# Patient Record
Sex: Male | Born: 1945 | Race: White | Hispanic: No | Marital: Married | Smoking: Former smoker
Health system: Southern US, Community
[De-identification: ages and names within clinical notes are randomized; demographics above are authoritative.]

## PROBLEM LIST (undated history)

## (undated) DIAGNOSIS — I509 Heart failure, unspecified: Secondary | ICD-10-CM

## (undated) DIAGNOSIS — E119 Type 2 diabetes mellitus without complications: Secondary | ICD-10-CM

## (undated) DIAGNOSIS — N186 End stage renal disease: Secondary | ICD-10-CM

## (undated) DIAGNOSIS — I10 Essential (primary) hypertension: Secondary | ICD-10-CM

## (undated) DIAGNOSIS — D649 Anemia, unspecified: Secondary | ICD-10-CM

## (undated) DIAGNOSIS — N289 Disorder of kidney and ureter, unspecified: Secondary | ICD-10-CM

---

## 2004-04-28 ENCOUNTER — Other Ambulatory Visit: Payer: Self-pay

## 2004-04-28 ENCOUNTER — Ambulatory Visit: Payer: Self-pay | Admitting: Urology

## 2004-05-04 ENCOUNTER — Ambulatory Visit: Payer: Self-pay | Admitting: Urology

## 2004-06-14 ENCOUNTER — Other Ambulatory Visit: Payer: Self-pay

## 2004-06-14 ENCOUNTER — Ambulatory Visit: Payer: Self-pay | Admitting: Urology

## 2004-06-22 ENCOUNTER — Ambulatory Visit: Payer: Self-pay | Admitting: Urology

## 2004-08-04 ENCOUNTER — Ambulatory Visit: Payer: Self-pay | Admitting: Urology

## 2004-08-10 ENCOUNTER — Ambulatory Visit: Payer: Self-pay | Admitting: Urology

## 2006-02-20 ENCOUNTER — Ambulatory Visit: Payer: Self-pay | Admitting: Urology

## 2006-05-25 ENCOUNTER — Inpatient Hospital Stay: Payer: Self-pay | Admitting: Internal Medicine

## 2006-05-25 ENCOUNTER — Other Ambulatory Visit: Payer: Self-pay

## 2006-06-07 ENCOUNTER — Emergency Department: Payer: Self-pay | Admitting: Emergency Medicine

## 2006-06-07 ENCOUNTER — Other Ambulatory Visit: Payer: Self-pay

## 2006-10-06 ENCOUNTER — Other Ambulatory Visit: Payer: Self-pay

## 2006-10-06 ENCOUNTER — Inpatient Hospital Stay: Payer: Self-pay | Admitting: Internal Medicine

## 2006-10-29 ENCOUNTER — Other Ambulatory Visit: Payer: Self-pay

## 2006-10-29 ENCOUNTER — Inpatient Hospital Stay: Payer: Self-pay | Admitting: Internal Medicine

## 2007-03-14 ENCOUNTER — Ambulatory Visit: Payer: Self-pay | Admitting: Urology

## 2007-11-16 IMAGING — CR DG ABDOMEN 1V
1 series · 3 of 3 positions shown · non-contrast
Comparison: none

REASON FOR EXAM: xray kub nephrolithiasis pt need film
COMMENTS:

[Series 1: view not recorded · 0.17mm/px · 3 of 3 slices shown]
[im 1/3]
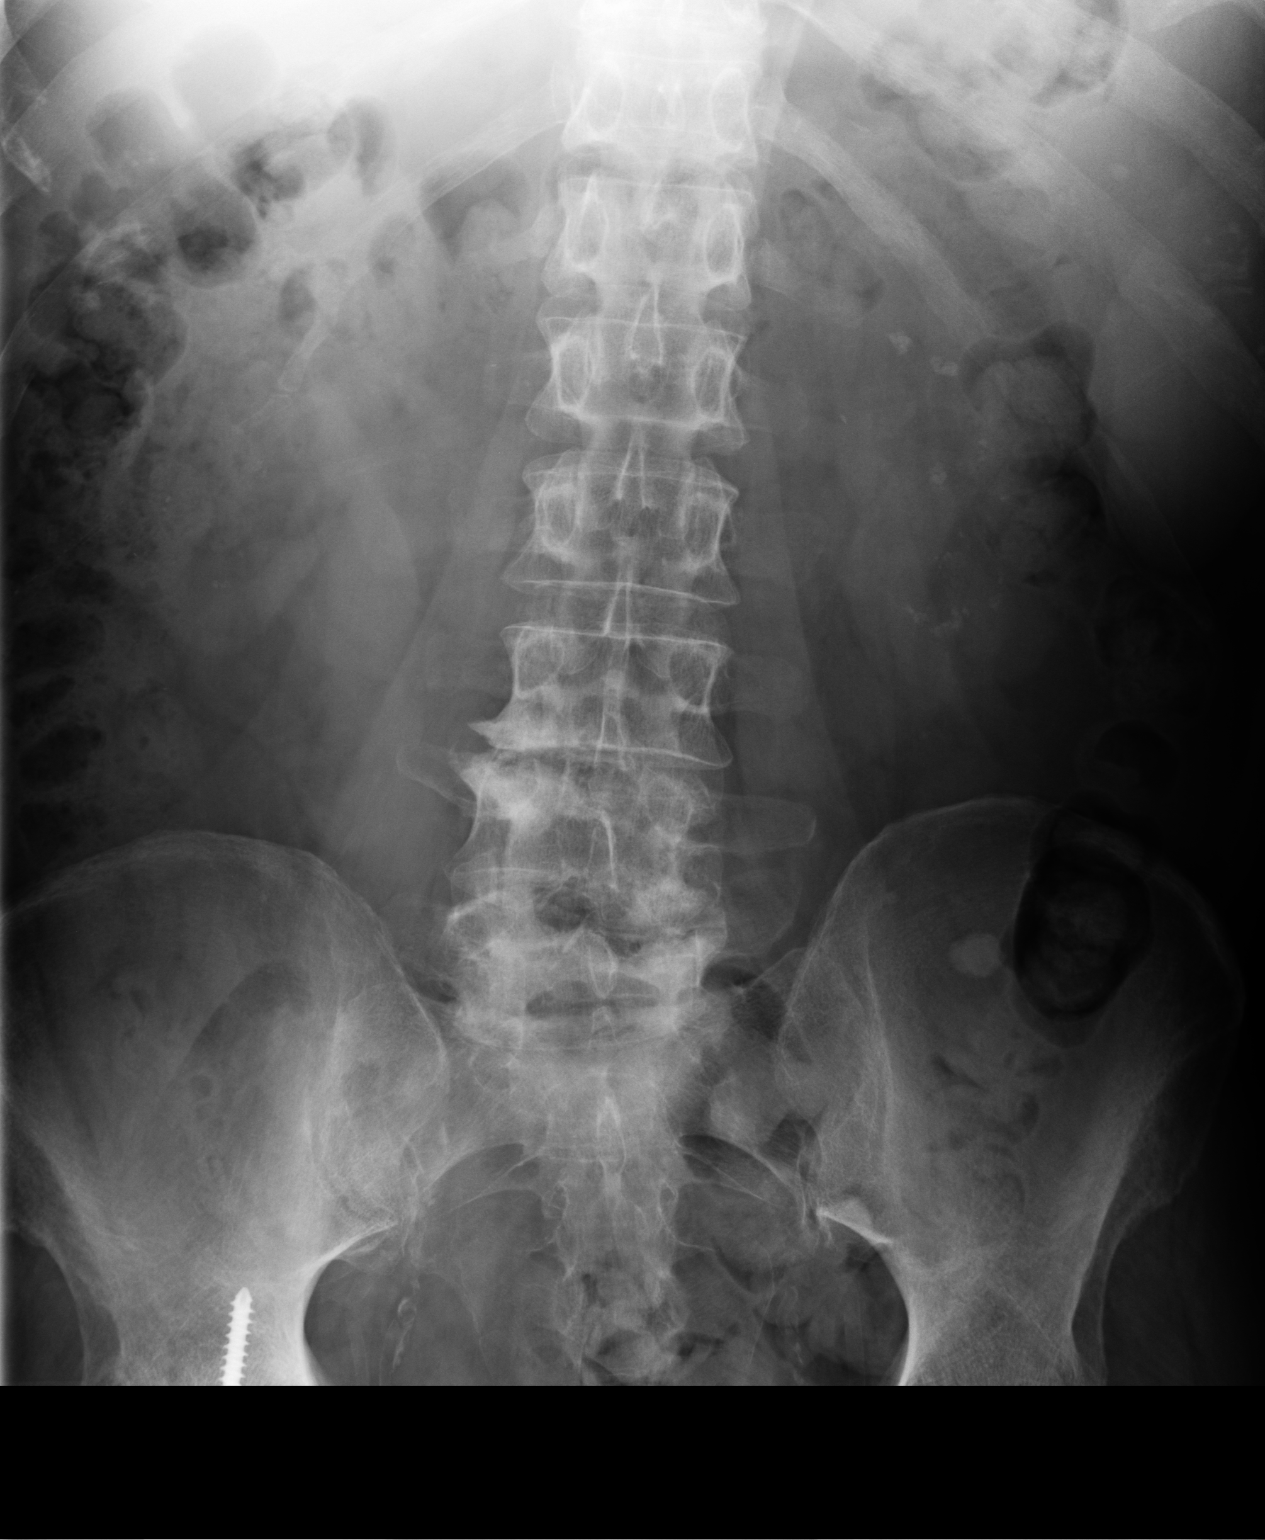
[im 2/3]
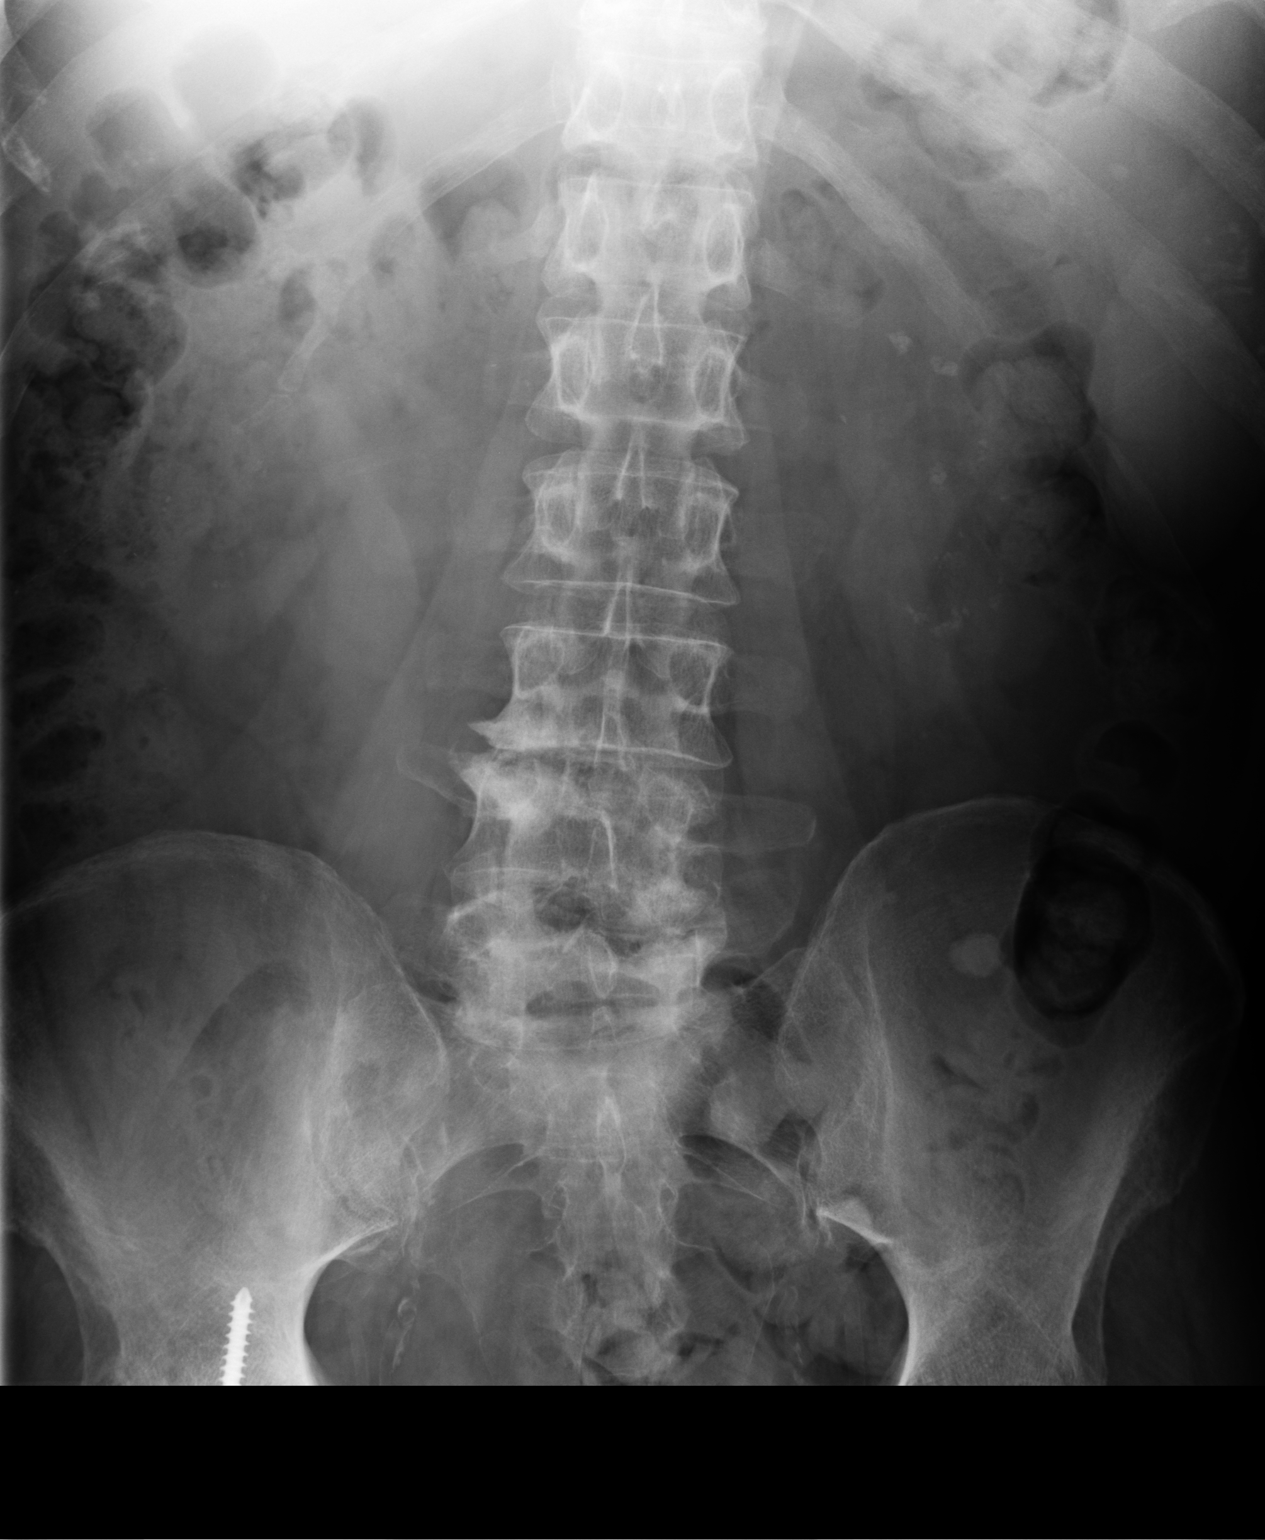
[im 3/3]
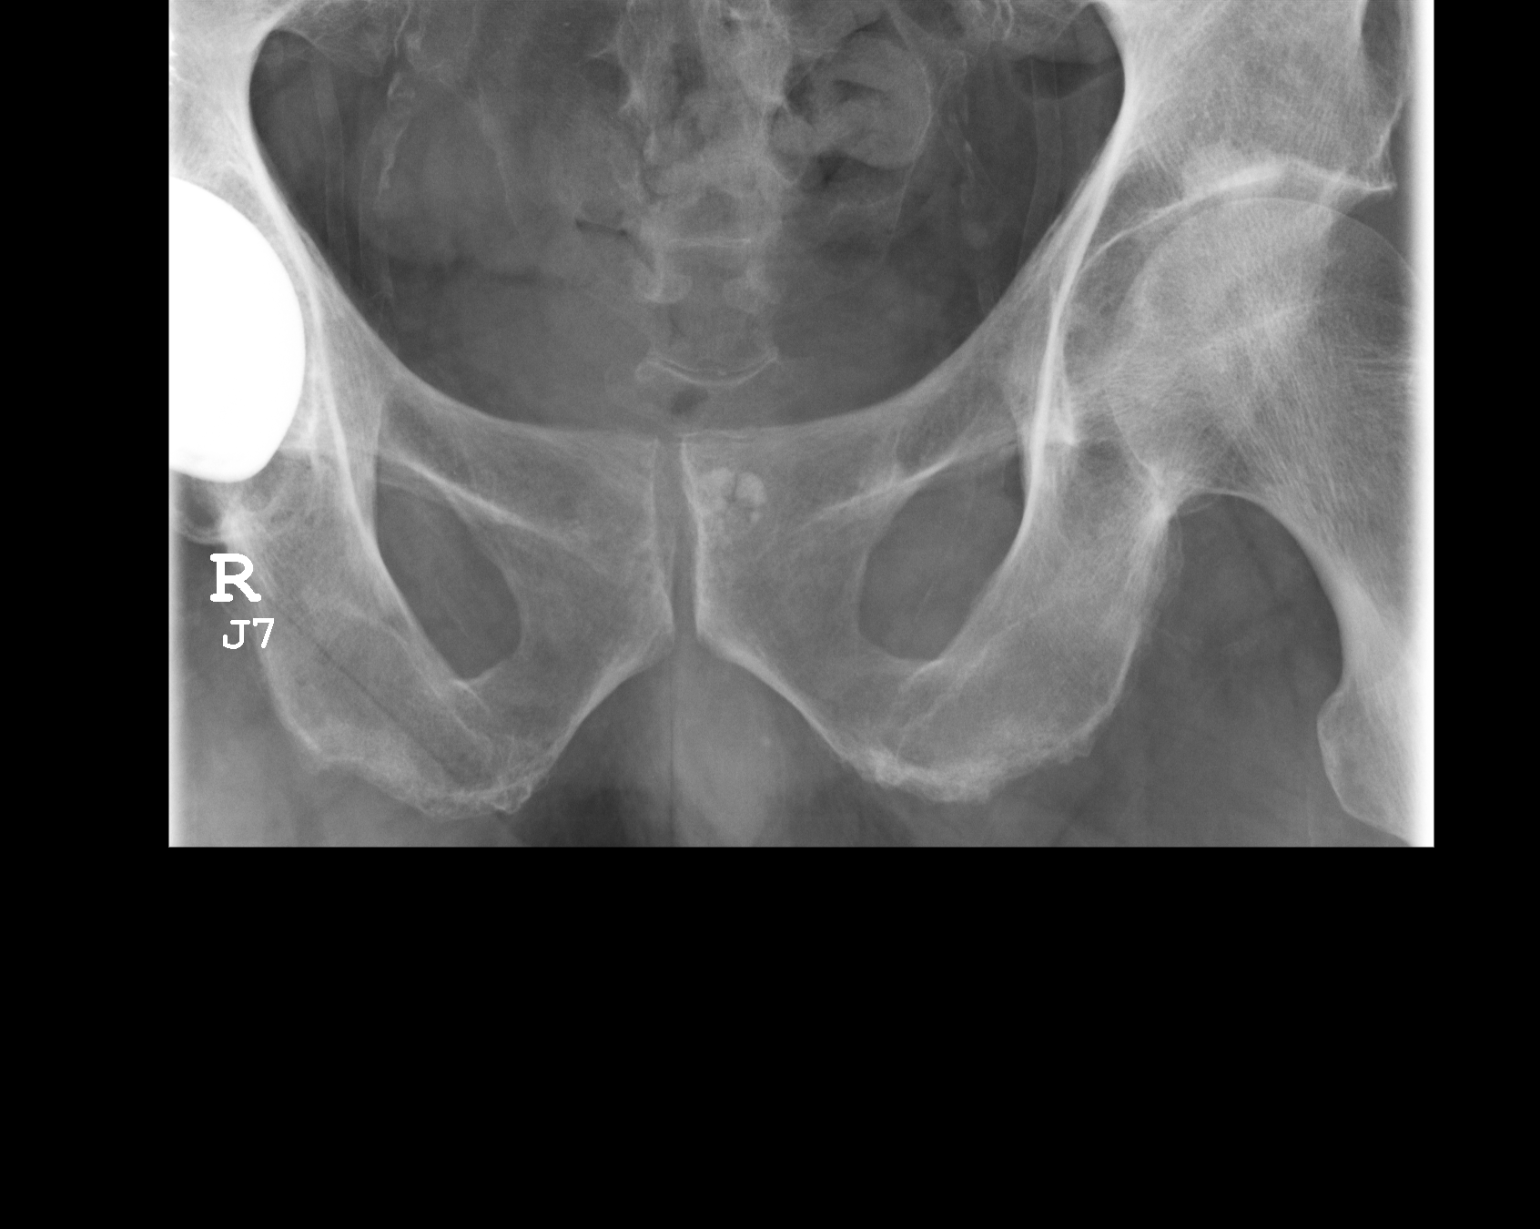

[3 of 3 positions shown; findings below may reference images not displayed]

PROCEDURE:     DXR - DXR KIDNEY URETER BLADDER  - February 20, 2006 [DATE]

RESULT:     AP view of the abdomen shows multiple calcifications projected
over the upper, lower and mid pole regions of the LEFT kidney and consistent
with renal caliceal stones.

On the RIGHT there are a few tiny caliceal stones visualized over the lower
pole region.  Additional stones may be present over the mid and upper pole
and obscured by bowel and bowel content. No ureteral stones are identified
on either side.
IMPRESSION: 1)Please see above.

## 2008-01-14 ENCOUNTER — Ambulatory Visit: Payer: Self-pay | Admitting: Urology

## 2008-02-18 IMAGING — CR DG CHEST 2V
1 series · 2 of 2 positions shown · non-contrast
Comparison: none

REASON FOR EXAM: RUQ pain
COMMENTS:

[Series 1: view not recorded · 0.17mm/px · 2 of 2 slices shown]
[im 1/2]
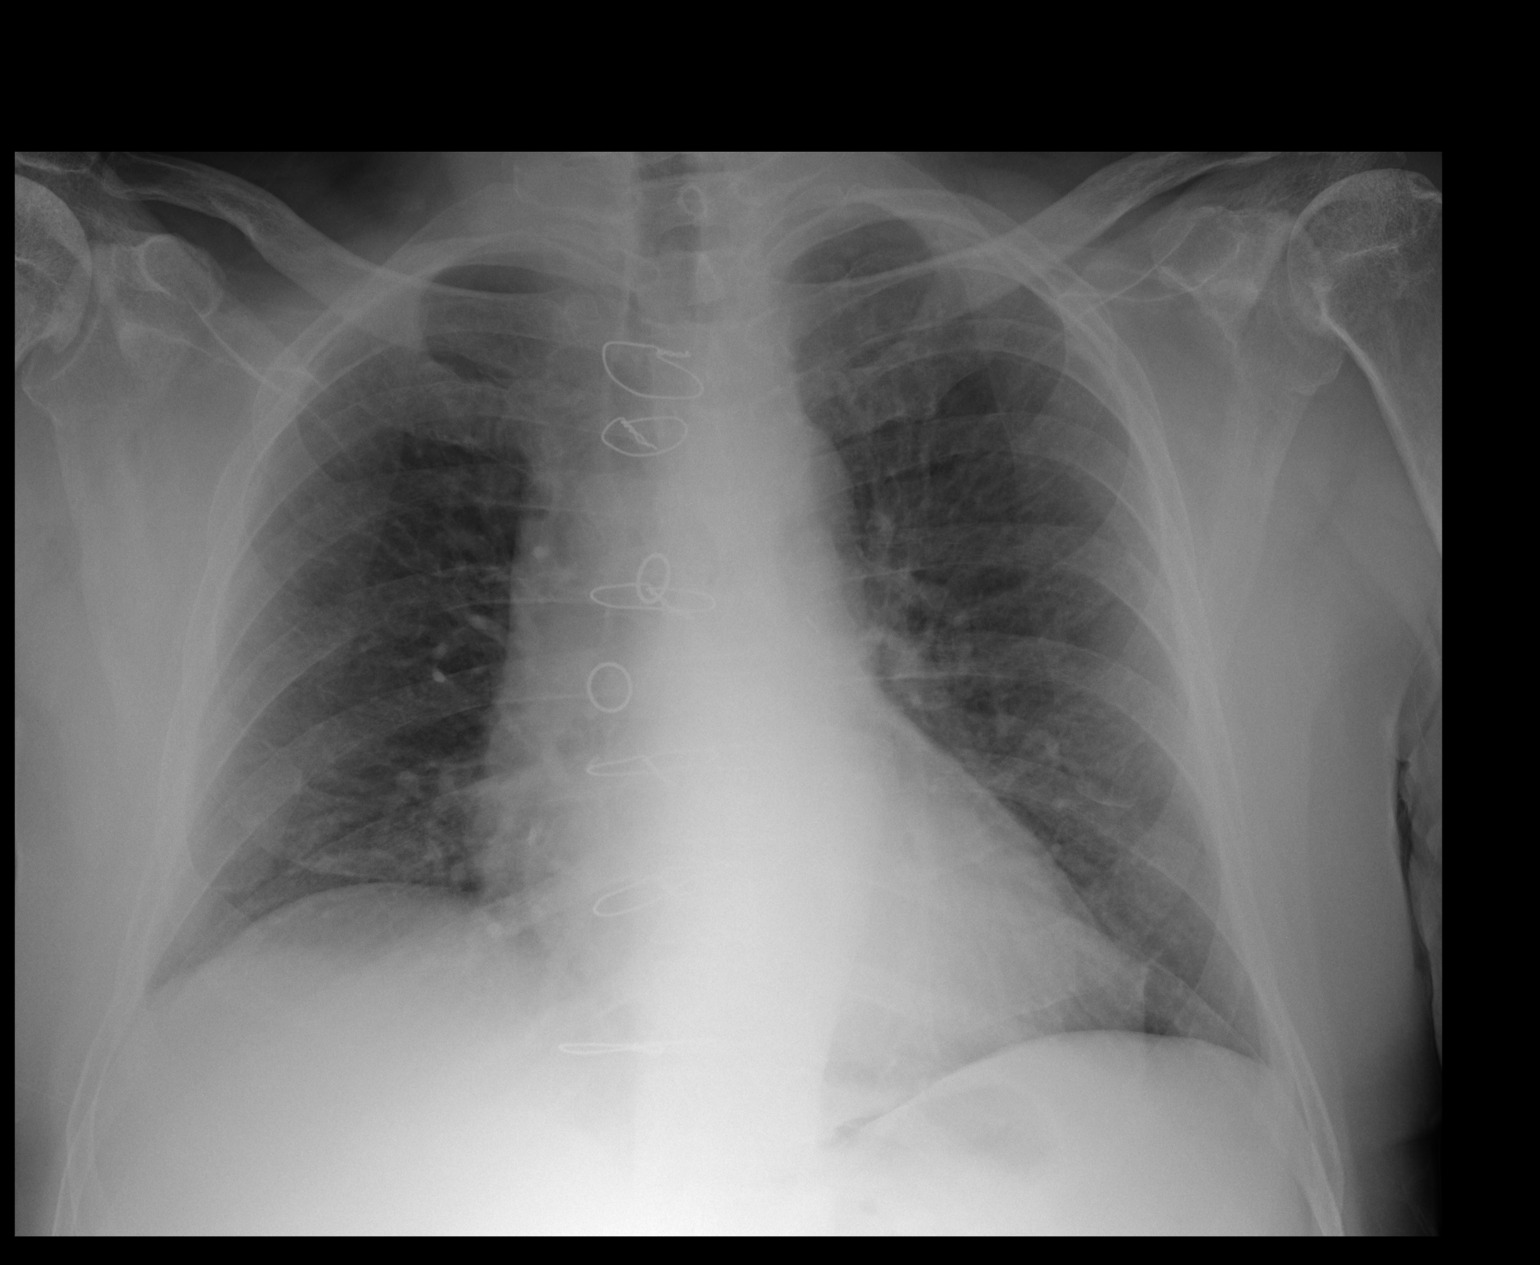
[im 2/2]
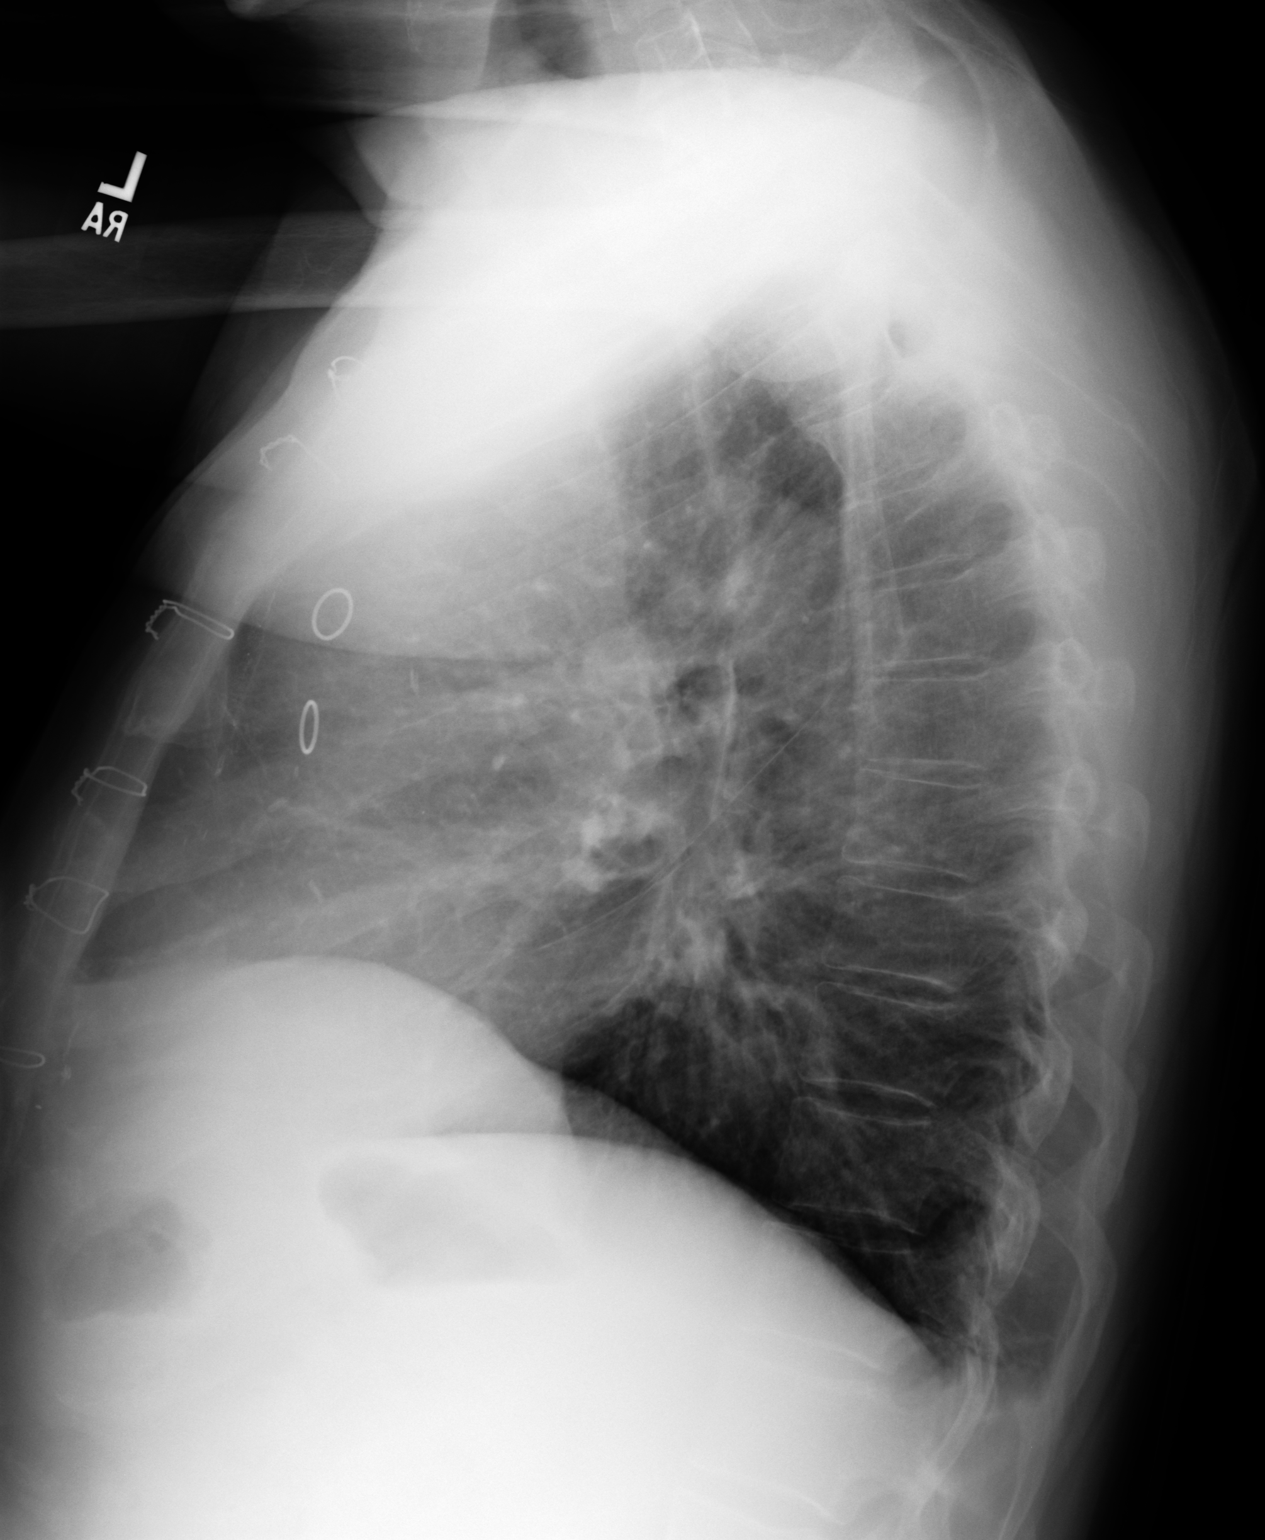

[2 of 2 positions shown; findings below may reference images not displayed]

PROCEDURE:     DXR - DXR CHEST PA (OR AP) AND LATERAL  - May 25, 2006  [DATE]

RESULT:          The lungs are adequately inflated.  The interstitial
markings are increased diffusely.  The patient has undergone prior CABG.
The heart is top normal in size, but the pulmonary vascularity is not
engorged.  I see no acute bony abnormality.
IMPRESSION: There are findings which may reflect an element of
underlying COPD and compensated congestive heart failure.  I do not see
evidence of pneumonia nor pulmonary edema.

## 2008-02-18 IMAGING — US ABDOMEN ULTRASOUND
1 series · 17 of 25 positions shown · non-contrast
Comparison: none

REASON FOR EXAM: RUQ pain, vomiting; [HOSPITAL]
COMMENTS:

[Series 1: abdomen ultrasound · 17 of 67 slices shown]
[im 1/67]
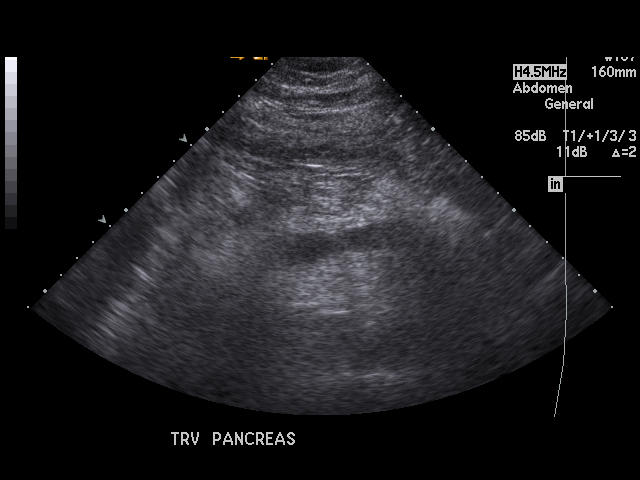
[im 6/67]
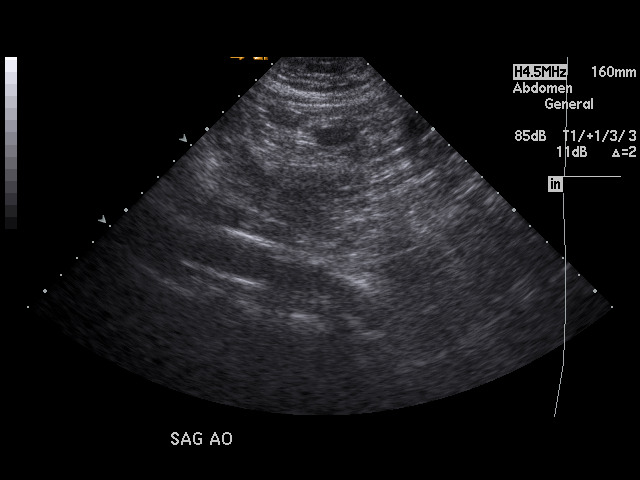
[im 9/67]
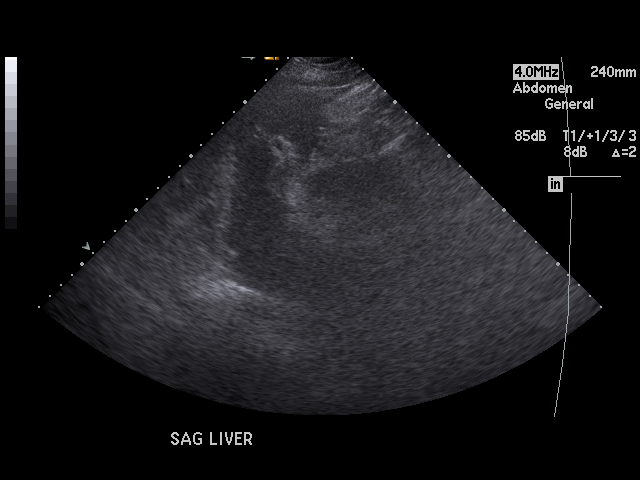
[im 14/67]
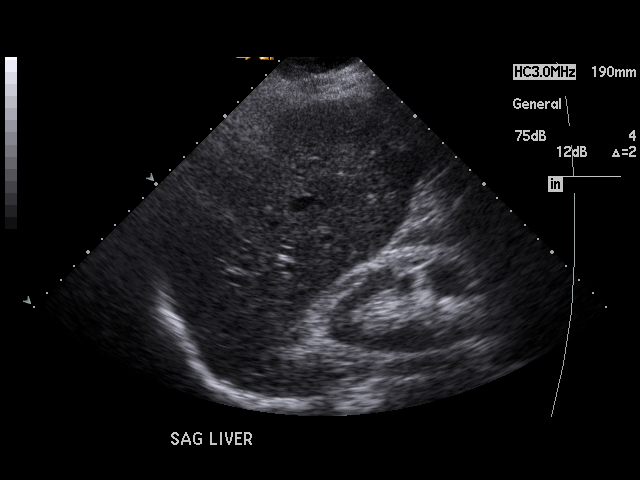
[im 17/67]
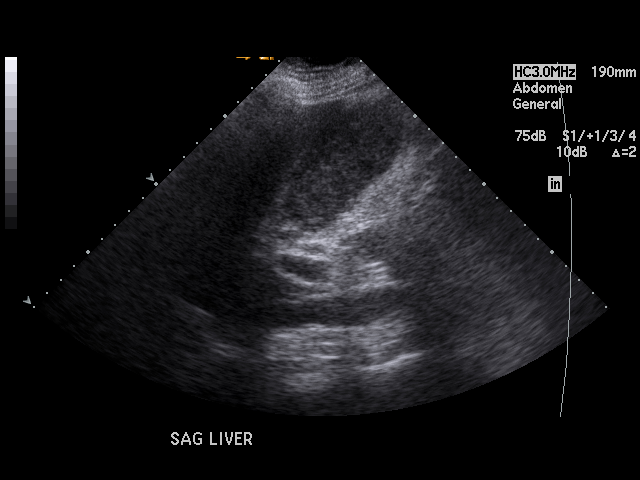
[im 23/67]
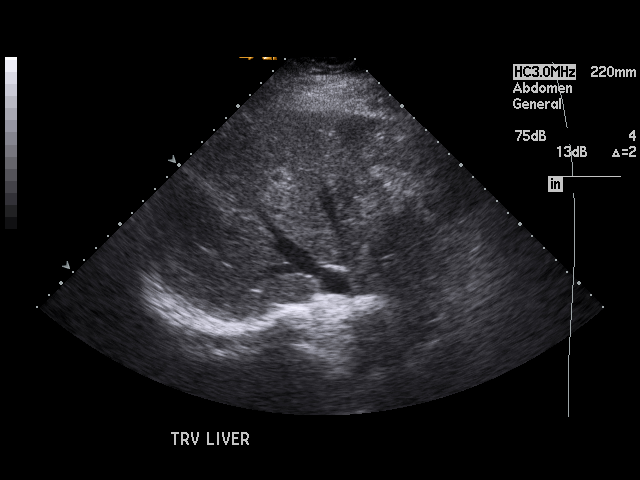
[im 25/67]
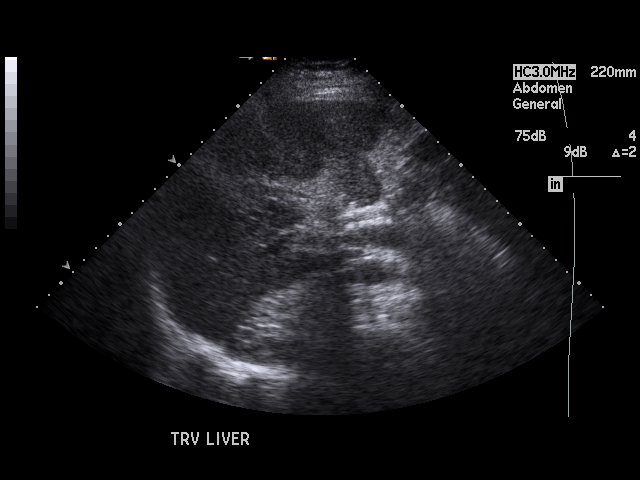
[im 31/67]
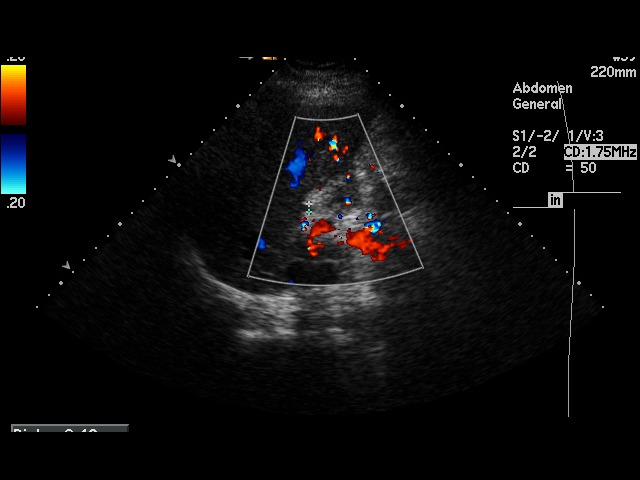
[im 34/67]
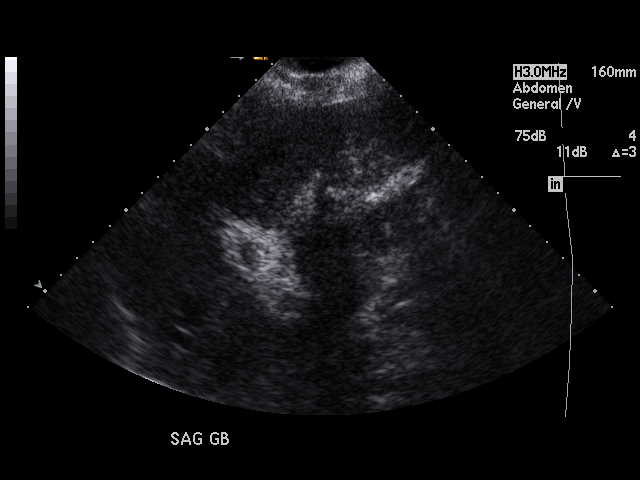
[im 36/67]
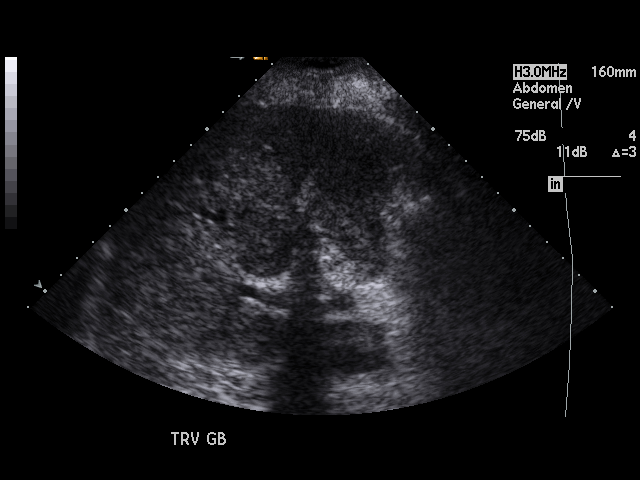
[im 42/67]
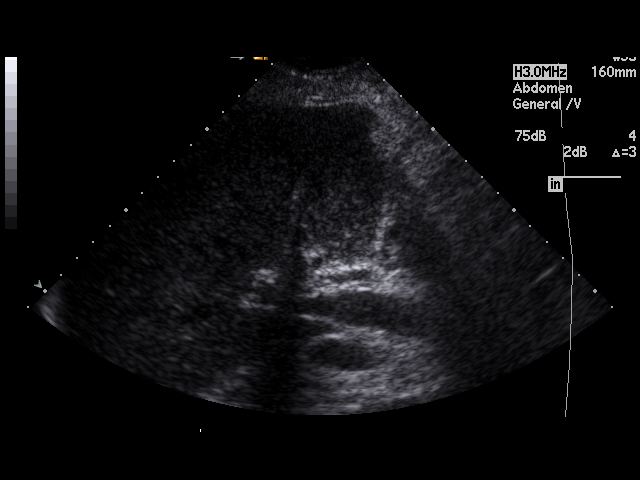
[im 45/67]
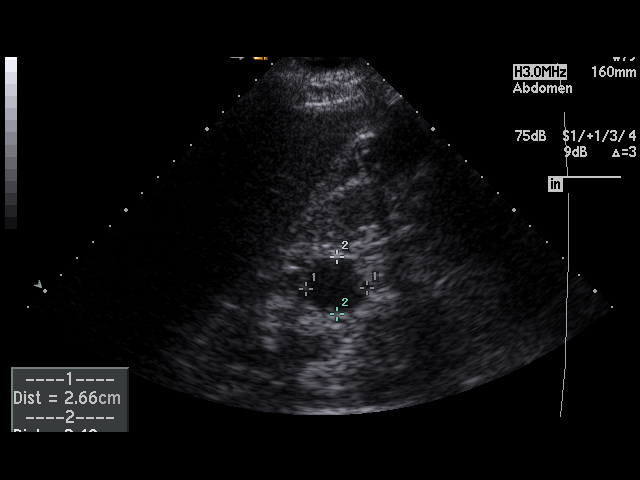
[im 50/67]
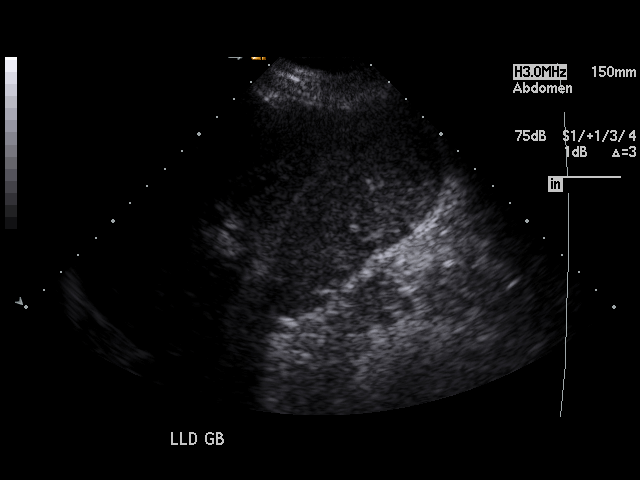
[im 53/67]
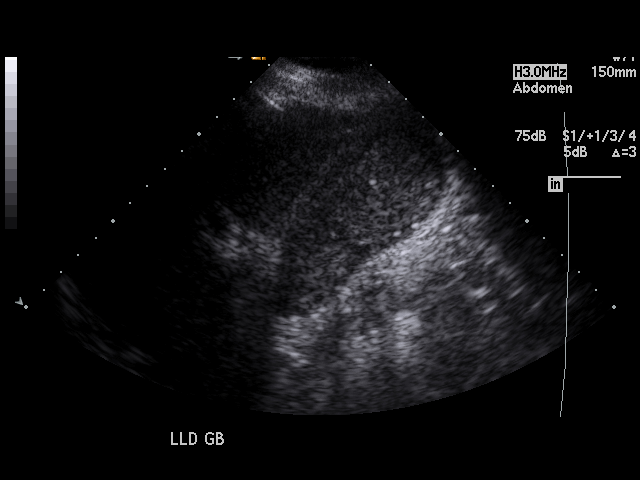
[im 58/67]
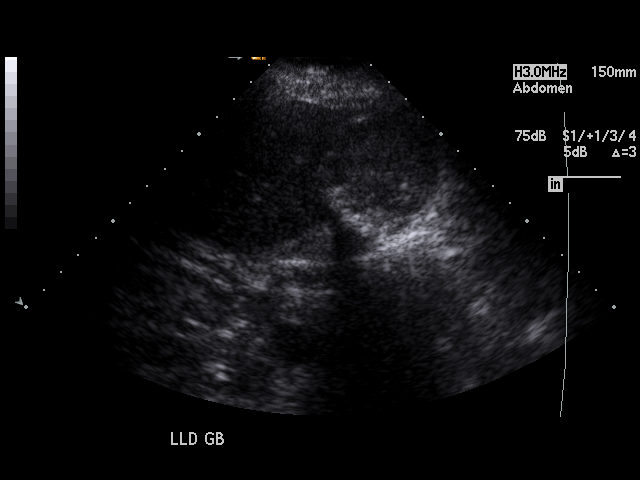
[im 61/67]
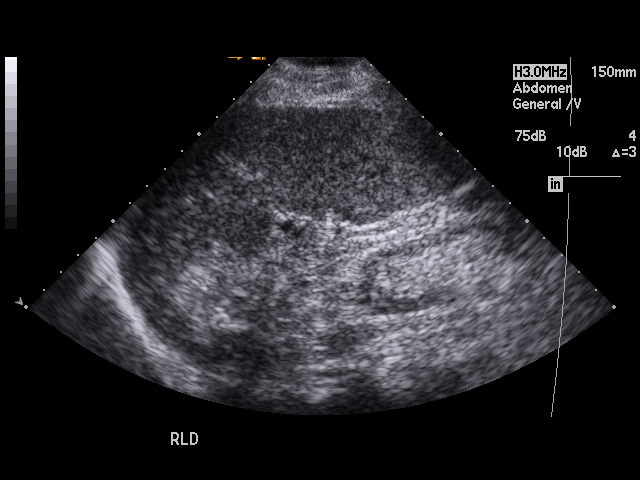
[im 67/67]
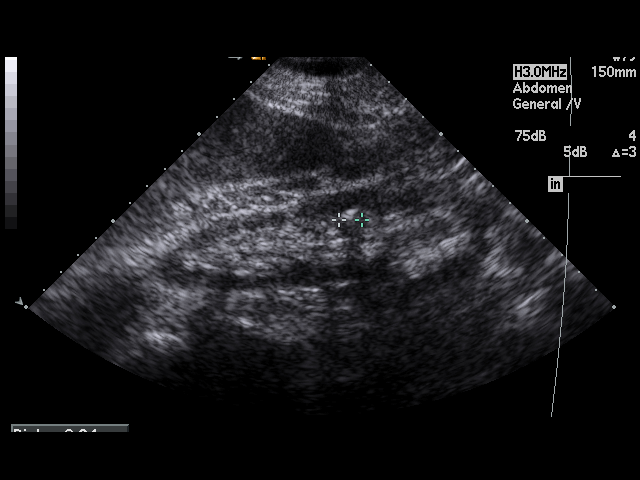

[17 of 25 positions shown; findings below may reference images not displayed]

PROCEDURE:     US  - US ABDOMEN GENERAL SURVEY  - May 25, 2006  [DATE]

RESULT:     Labelle Salha [facility] abdominal ultrasound [DATE] to 8 x 6 M
2. The patient has gallstones seen on recent chest depression reason
abdominal CT scan.

The gallbladder is adequately distended with the lumen filled with echogenic
material. There a few cleared pressure few calcified stone present a large
amount of sludge is present. No wall thickening or pericholecystic fluid is
seen. There is no sonographic Murphy's sign. The common bile duct measured
4.8 mm in diameter. The pancreas cannot be demonstrated due to presence of
bowel gas. The spleen is mildly enlarged approximately 14.8 cm. The liver
exhibits increased echotexture with likely reflects known fatty
infiltration. There nonobstructing stones in the left kidney. There is
extrarenal pelvis involving the right kidney. Survey views of the abdominal
aorta are normal. There is no evidence of ascites.
IMPRESSION: 1. The gallstones the gallbladder is mildly distended and contains a few
stones but a large amount of sludge. A sonographic Murphy's sign or wall
thickening or pericholecystic fluid.
2. The liver exhibits findings which likely reflect fatty infiltration. The
common bile duct is normal caliber.
3. The pancreas could not be demonstrated due to presence of bowel gas.
There is an extrarenal pelvis on the right near nonobstructing stone from
the left. Paraspinous occulta emergency department the conclusion of the
study. In

## 2008-06-23 ENCOUNTER — Ambulatory Visit: Payer: Self-pay | Admitting: Vascular Surgery

## 2008-08-20 ENCOUNTER — Ambulatory Visit: Payer: Self-pay | Admitting: Podiatry

## 2008-08-27 ENCOUNTER — Ambulatory Visit: Payer: Self-pay | Admitting: Podiatry

## 2008-08-31 ENCOUNTER — Inpatient Hospital Stay: Payer: Self-pay | Admitting: Podiatry

## 2008-11-01 ENCOUNTER — Ambulatory Visit: Payer: Self-pay | Admitting: Vascular Surgery

## 2008-11-05 ENCOUNTER — Inpatient Hospital Stay: Payer: Self-pay | Admitting: Podiatry

## 2008-12-02 ENCOUNTER — Encounter: Payer: Self-pay | Admitting: Internal Medicine

## 2008-12-10 ENCOUNTER — Ambulatory Visit: Payer: Self-pay | Admitting: Podiatry

## 2008-12-21 ENCOUNTER — Encounter: Payer: Self-pay | Admitting: Internal Medicine

## 2009-01-20 ENCOUNTER — Encounter: Payer: Self-pay | Admitting: Internal Medicine

## 2009-01-26 ENCOUNTER — Inpatient Hospital Stay: Payer: Self-pay | Admitting: Student

## 2010-04-14 ENCOUNTER — Ambulatory Visit: Payer: Self-pay | Admitting: Specialist

## 2010-09-25 ENCOUNTER — Ambulatory Visit: Payer: Self-pay | Admitting: Urology

## 2011-02-09 ENCOUNTER — Emergency Department: Payer: Self-pay | Admitting: Emergency Medicine

## 2015-02-16 ENCOUNTER — Other Ambulatory Visit: Payer: Self-pay | Admitting: Internal Medicine

## 2015-02-16 DIAGNOSIS — R319 Hematuria, unspecified: Secondary | ICD-10-CM

## 2015-02-22 ENCOUNTER — Ambulatory Visit
Admission: RE | Admit: 2015-02-22 | Discharge: 2015-02-22 | Disposition: A | Discharge status: Home / Self Care | Payer: Medicare Other | Source: Ambulatory Visit | Attending: Internal Medicine | Admitting: Internal Medicine

## 2015-02-22 DIAGNOSIS — K769 Liver disease, unspecified: Secondary | ICD-10-CM | POA: Diagnosis not present

## 2015-02-22 DIAGNOSIS — R161 Splenomegaly, not elsewhere classified: Secondary | ICD-10-CM | POA: Insufficient documentation

## 2015-02-22 DIAGNOSIS — R319 Hematuria, unspecified: Secondary | ICD-10-CM | POA: Diagnosis present

## 2015-02-22 DIAGNOSIS — N281 Cyst of kidney, acquired: Secondary | ICD-10-CM | POA: Insufficient documentation

## 2015-03-18 ENCOUNTER — Ambulatory Visit: Payer: Self-pay | Admitting: Urology

## 2015-03-24 ENCOUNTER — Ambulatory Visit: Payer: Self-pay | Admitting: Urology

## 2016-01-23 ENCOUNTER — Emergency Department
Admission: EM | Admit: 2016-01-23 | Discharge: 2016-01-23 | Disposition: A | Discharge status: Home / Self Care | Payer: Medicare Other | Attending: Emergency Medicine | Admitting: Emergency Medicine

## 2016-01-23 ENCOUNTER — Emergency Department: Payer: Medicare Other

## 2016-01-23 ENCOUNTER — Encounter: Payer: Self-pay | Admitting: Emergency Medicine

## 2016-01-23 DIAGNOSIS — S41112A Laceration without foreign body of left upper arm, initial encounter: Secondary | ICD-10-CM | POA: Diagnosis not present

## 2016-01-23 DIAGNOSIS — Z87891 Personal history of nicotine dependence: Secondary | ICD-10-CM | POA: Diagnosis not present

## 2016-01-23 DIAGNOSIS — Y999 Unspecified external cause status: Secondary | ICD-10-CM | POA: Diagnosis not present

## 2016-01-23 DIAGNOSIS — Y929 Unspecified place or not applicable: Secondary | ICD-10-CM | POA: Diagnosis not present

## 2016-01-23 DIAGNOSIS — I509 Heart failure, unspecified: Secondary | ICD-10-CM | POA: Diagnosis not present

## 2016-01-23 DIAGNOSIS — E1122 Type 2 diabetes mellitus with diabetic chronic kidney disease: Secondary | ICD-10-CM | POA: Insufficient documentation

## 2016-01-23 DIAGNOSIS — N186 End stage renal disease: Secondary | ICD-10-CM | POA: Insufficient documentation

## 2016-01-23 DIAGNOSIS — S61412A Laceration without foreign body of left hand, initial encounter: Secondary | ICD-10-CM | POA: Insufficient documentation

## 2016-01-23 DIAGNOSIS — Y9389 Activity, other specified: Secondary | ICD-10-CM | POA: Insufficient documentation

## 2016-01-23 DIAGNOSIS — S0990XA Unspecified injury of head, initial encounter: Secondary | ICD-10-CM | POA: Diagnosis present

## 2016-01-23 DIAGNOSIS — I132 Hypertensive heart and chronic kidney disease with heart failure and with stage 5 chronic kidney disease, or end stage renal disease: Secondary | ICD-10-CM | POA: Diagnosis not present

## 2016-01-23 HISTORY — DX: Anemia, unspecified: D64.9

## 2016-01-23 HISTORY — DX: End stage renal disease: N18.6

## 2016-01-23 HISTORY — DX: Heart failure, unspecified: I50.9

## 2016-01-23 HISTORY — DX: Essential (primary) hypertension: I10

## 2016-01-23 HISTORY — DX: Disorder of kidney and ureter, unspecified: N28.9

## 2016-01-23 HISTORY — DX: Type 2 diabetes mellitus without complications: E11.9

## 2016-01-23 LAB — BASIC METABOLIC PANEL
ANION GAP: 18 — AB (ref 5–15)
BUN: 51 mg/dL — ABNORMAL HIGH (ref 6–20)
CHLORIDE: 93 mmol/L — AB (ref 101–111)
CO2: 26 mmol/L (ref 22–32)
Calcium: 8 mg/dL — ABNORMAL LOW (ref 8.9–10.3)
Creatinine, Ser: 4.88 mg/dL — ABNORMAL HIGH (ref 0.61–1.24)
GFR calc Af Amer: 13 mL/min — ABNORMAL LOW (ref >60)
GFR calc non Af Amer: 11 mL/min — ABNORMAL LOW (ref >60)
GLUCOSE: 160 mg/dL — AB (ref 65–99)
POTASSIUM: 4.8 mmol/L (ref 3.5–5.1)
Sodium: 137 mmol/L (ref 135–145)

## 2016-01-23 MED ORDER — ACETAMINOPHEN 325 MG PO TABS
650.0000 mg | ORAL_TABLET | Freq: Once | ORAL | Status: AC
  Administered 2016-01-23: 650 mg via ORAL
  Filled 2016-01-23: qty 2

## 2016-01-23 MED ORDER — BACITRACIN ZINC 500 UNIT/GM EX OINT
TOPICAL_OINTMENT | CUTANEOUS | Status: AC
  Filled 2016-01-23: qty 0.9

## 2016-01-23 NOTE — ED Notes (Signed)
Pt back from Ct

## 2016-01-23 NOTE — Discharge Instructions (Signed)
Return to the emergency room for any signs of infection such as new redness, drainage, or fevers or pain from the skin tear sites.  Return to the ER for any new or worsening pain such as headache, chest pain, abdominal pain, or any other symptoms concerning to you.   Deep Skin Avulsion A deep skin avulsion is a type of open wound. It often results from a severe injury (trauma) that tears away all layers of the skin or an entire body part. The areas of the body that are most often affected by a deep skin avulsion include the face, lips, ears, nose, and fingers. A deep skin avulsion may make structures below the skin become visible. You may be able to see muscle, bone, nerves, and blood vessels. A deep skin avulsion can also damage important structures beneath the skin. These include tendons, ligaments, nerves, or blood vessels. CAUSES Injuries that often cause a deep skin avulsion include:  Being crushed.  Falling against a jagged surface.  Animal bites.  Gunshot wounds.  Severe burns.  Injuries that involve being dragged, such as bicycle or motorcycle accidents. SYMPTOMS Symptoms of a deep skin avulsion include:  Pain.  Numbness.  Swelling.  A misshapen body part.  Bleeding, which may be heavy.  Fluid leaking from the wound. DIAGNOSIS This condition may be diagnosed with a medical history and physical exam. You may also have X-rays done. TREATMENT The treatment that is chosen for a deep skin avulsion depends on how large and deep the wound is and where it is located. Treatment for all types of avulsions usually starts with:  Controlling the bleeding.  Washing out the wound with a germ-free (sterile) salt-water solution.  Removing dead tissue from the wound. A wound may be closed or left open to heal. This depends on the size and location of the wound and whether it is likely to become infected. Wounds are usually covered or closed if they expose blood vessels, nerves,  bone, or cartilage.  Wounds that are small and clean may be closed with stitches (sutures).  Wounds that cannot be closed with sutures may be covered with a piece of skin (graft) or a skin flap. Skin may be taken from on or near the wound, from another part of the body, or from a donor.  Wounds may be left open if they are hard to close or they may become infected. These wounds heal over time from the bottom up. You may also receive medicine. This may include:  Antibiotics.  A tetanus shot.  Rabies vaccine. HOME CARE INSTRUCTIONS Medicines  Take or apply over-the-counter and prescription medicines only as told by your health care provider.  If you were prescribed an antibiotic, take or apply it as told by your health care provider. Do not stop taking the antibiotic even if your condition improves.  You may get anti-itch medicine while your wound is healing. Use it only as told by your health care provider. Wound Care  There are many ways to close and cover a wound. For example, a wound can be covered with sutures, skin glue, or adhesive strips. Follow instructions from your health care provider about:  How to take care of your wound.  When and how you should change your bandage (dressing).  When you should remove your dressing.  Removing whatever was used to close your wound.  Keep the dressing dry as told by your health care provider. Do not take baths, swim, use a hot tub, or  do anything that would put your wound underwater until your health care provider approves.  Clean the wound each day or as told by your health care provider.  Wash the wound with mild soap and water.  Rinse the wound with water to remove all soap.  Pat the wound dry with a clean towel. Do not rub it.  Do not scratch or pick at the wound.  Check your wound every day for signs of infection. Watch for:  Redness, swelling, or pain.  Fluid, blood, or pus. General Instructions  Raise (elevate)  the injured area above the level of your heart while you are sitting or lying down.  Keep all follow-up visits as told by your health care provider. This is important. SEEK MEDICAL CARE IF:  You received a tetanus shot and you have swelling, severe pain, redness, or bleeding at the injection site.  You have a fever.  Your pain is not controlled with medicine.  You have increased redness, swelling, or pain at the site of your wound.  You have fluid, blood, or pus coming from your wound.  You notice a bad smell coming from your wound or your dressing.  A wound that was closed breaks open.  You notice something coming out of the wound, such as wood or glass.  You notice a change in the color of your skin near your wound.  You develop a new rash.  You need to change the dressing frequently due to fluid, blood, or pus draining from the wound. SEEK IMMEDIATE MEDICAL CARE IF:  Your pain suddenly increases and is severe.  You develop severe swelling around the wound.  You develop numbness around the wound.  You have nausea and vomiting that does not go away after 24 hours.  You feel light-headed, weak, or faint.  You develop chest pain.  You have trouble breathing.  Your wound is on your hand or foot and you cannot properly move a finger or toe.  The wound is on your hand or foot and you notice that your fingers or toes look pale or bluish.  You have a red streak going away from your wound.   This information is not intended to replace advice given to you by your health care provider. Make sure you discuss any questions you have with your health care provider.   Document Released: 09/04/2006 Document Revised: 11/23/2014 Document Reviewed: 07/14/2014 Elsevier Interactive Patient Education [employer]2016 [employer]vier Inc.
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
## 2016-03-03 ENCOUNTER — Emergency Department
Admission: EM | Admit: 2016-03-03 | Discharge: 2016-03-03 | Payer: Medicare Other | Attending: Student | Admitting: Student

## 2016-03-03 ENCOUNTER — Emergency Department: Payer: Medicare Other

## 2016-03-03 ENCOUNTER — Encounter: Payer: Self-pay | Admitting: Emergency Medicine

## 2016-03-03 DIAGNOSIS — Z992 Dependence on renal dialysis: Secondary | ICD-10-CM | POA: Insufficient documentation

## 2016-03-03 DIAGNOSIS — I509 Heart failure, unspecified: Secondary | ICD-10-CM | POA: Diagnosis not present

## 2016-03-03 DIAGNOSIS — Z87891 Personal history of nicotine dependence: Secondary | ICD-10-CM | POA: Insufficient documentation

## 2016-03-03 DIAGNOSIS — E1122 Type 2 diabetes mellitus with diabetic chronic kidney disease: Secondary | ICD-10-CM | POA: Insufficient documentation

## 2016-03-03 DIAGNOSIS — I132 Hypertensive heart and chronic kidney disease with heart failure and with stage 5 chronic kidney disease, or end stage renal disease: Secondary | ICD-10-CM | POA: Diagnosis not present

## 2016-03-03 DIAGNOSIS — N186 End stage renal disease: Secondary | ICD-10-CM | POA: Insufficient documentation

## 2016-03-03 DIAGNOSIS — R7989 Other specified abnormal findings of blood chemistry: Secondary | ICD-10-CM | POA: Diagnosis not present

## 2016-03-03 DIAGNOSIS — J189 Pneumonia, unspecified organism: Secondary | ICD-10-CM

## 2016-03-03 DIAGNOSIS — R0602 Shortness of breath: Secondary | ICD-10-CM | POA: Diagnosis present

## 2016-03-03 DIAGNOSIS — R778 Other specified abnormalities of plasma proteins: Secondary | ICD-10-CM

## 2016-03-03 DIAGNOSIS — R0600 Dyspnea, unspecified: Secondary | ICD-10-CM

## 2016-03-03 LAB — BASIC METABOLIC PANEL
ANION GAP: 9 (ref 5–15)
BUN: 24 mg/dL — ABNORMAL HIGH (ref 6–20)
CALCIUM: 8.2 mg/dL — AB (ref 8.9–10.3)
CHLORIDE: 97 mmol/L — AB (ref 101–111)
CO2: 30 mmol/L (ref 22–32)
Creatinine, Ser: 3.57 mg/dL — ABNORMAL HIGH (ref 0.61–1.24)
GFR calc non Af Amer: 16 mL/min — ABNORMAL LOW (ref >60)
GFR, EST AFRICAN AMERICAN: 18 mL/min — AB (ref >60)
GLUCOSE: 126 mg/dL — AB (ref 65–99)
POTASSIUM: 4.2 mmol/L (ref 3.5–5.1)
Sodium: 136 mmol/L (ref 135–145)

## 2016-03-03 LAB — CBC
HEMATOCRIT: 28 % — AB (ref 40.0–52.0)
HEMOGLOBIN: 9.2 g/dL — AB (ref 13.0–18.0)
MCH: 27.4 pg (ref 26.0–34.0)
MCHC: 32.8 g/dL (ref 32.0–36.0)
MCV: 83.6 fL (ref 80.0–100.0)
Platelets: 98 10*3/uL — ABNORMAL LOW (ref 150–440)
RBC: 3.35 MIL/uL — AB (ref 4.40–5.90)
RDW: 17.3 % — ABNORMAL HIGH (ref 11.5–14.5)
WBC: 8.1 10*3/uL (ref 3.8–10.6)

## 2016-03-03 LAB — LACTIC ACID, PLASMA: Lactic Acid, Venous: 1.1 mmol/L (ref 0.5–1.9)

## 2016-03-03 LAB — BRAIN NATRIURETIC PEPTIDE: B Natriuretic Peptide: 2383 pg/mL — ABNORMAL HIGH (ref 0.0–100.0)

## 2016-03-03 LAB — TROPONIN I: TROPONIN I: 0.1 ng/mL — AB (ref <0.03)

## 2016-03-03 MED ORDER — SODIUM CHLORIDE 0.9 % IV BOLUS (SEPSIS)
250.0000 mL | Freq: Once | INTRAVENOUS | Status: AC
  Administered 2016-03-03: 250 mL via INTRAVENOUS

## 2016-03-03 MED ORDER — VANCOMYCIN HCL IN DEXTROSE 1-5 GM/200ML-% IV SOLN
1000.0000 mg | Freq: Once | INTRAVENOUS | Status: AC
  Administered 2016-03-03: 1000 mg via INTRAVENOUS
  Filled 2016-03-03: qty 200

## 2016-03-03 MED ORDER — PIPERACILLIN-TAZOBACTAM 3.375 G IVPB 30 MIN
3.3750 g | Freq: Once | INTRAVENOUS | Status: AC
  Administered 2016-03-03: 3.375 g via INTRAVENOUS
  Filled 2016-03-03: qty 50

## 2016-03-03 MED ORDER — LEVOFLOXACIN IN D5W 750 MG/150ML IV SOLN
750.0000 mg | Freq: Once | INTRAVENOUS | Status: AC
  Administered 2016-03-03: 750 mg via INTRAVENOUS
  Filled 2016-03-03: qty 150

## 2016-03-03 NOTE — ED Provider Notes (Signed)
[facility] Emergency Department Provider Note   ____________________________________________   None    (approximate)  I have reviewed the triage vital signs and the nursing notes.   HISTORY  Chief Complaint Shortness of Breath    HPI Dennis Montgomery. is a 70 y.o. male with history of CHF, diabetes, end-stage renal disease on dialysis Monday Wednesday and Friday, last dialyzed yesterday, hypertension, diagnosed at an discharged from the [place] 2 days ago with new diagnosis of metastatic lung cancer presents for evaluation of 2 days of worsening shortness of breath, worse with exertion, severe, gradual onset and constant. No chest pain. No fevers or chills. No vomiting or diarrhea.   Past Medical History:  Diagnosis Date  . Anemia   . CHF (congestive heart failure) (HCC)   . Diabetes mellitus without complication (HCC)   . ESRD (end stage renal disease) (HCC)   . Hypertension   . Renal disorder    kidney failure    There are no active problems to display for this patient.   History reviewed. No pertinent surgical history.  Prior to Admission medications   Not on File    Allergies Cialis [tadalafil]; Hydrocodone; Iodine; Levitra [vardenafil]; Oxycodone; and Viagra [sildenafil citrate]  No family history on file.  Social History Social History  Substance Use Topics  . Smoking status: Former Games developer  . Smokeless tobacco: Not on file  . Alcohol use No    Review of Systems Constitutional: No fever/chills Eyes: No visual changes. ENT: No sore throat. Cardiovascular: Denies chest pain. Respiratory: + shortness of breath. Gastrointestinal: No abdominal pain.  No nausea, no vomiting.  No diarrhea.  No constipation. Genitourinary: Negative for dysuria. Musculoskeletal: Negative for back pain. Skin: Negative for rash. Neurological: Negative for headaches, focal weakness or numbness.  10-point ROS otherwise  negative.  ____________________________________________   PHYSICAL EXAM:  Vitals:   03/03/16 1630 03/03/16 1700 03/03/16 1730 03/03/16 1749  BP: (!) 112/55 (!) 116/54 (!) 100/43   Pulse: 77 86 85   Resp: (!) 27 20 (!) 21   Temp:    97.9 F (36.6 C)  TempSrc:    Oral  SpO2: 95% 98% 93%   Weight:        VITAL SIGNS: ED Triage Vitals  Enc Vitals Group     BP 03/03/16 1440 105/76     Pulse Rate 03/03/16 1440 86     Resp 03/03/16 1440 20     Temp 03/03/16 1440 97.6 F (36.4 C)     Temp Source 03/03/16 1440 Oral     SpO2 03/03/16 1440 100 %     Weight 03/03/16 1441 128 lb (58.1 kg)     Height --      Head Circumference --      Peak Flow --      Pain Score --      Pain Loc --      Pain Edu? --      Excl. in GC? --     Constitutional: Alert and oriented. Fatigued, ill appearing, in no acute distress. Eyes: Conjunctivae are normal. PERRL. EOMI. Head: Atraumatic. Nose: No congestion/rhinnorhea. Mouth/Throat: Mucous membranes are moist.  Oropharynx non-erythematous. Neck: No stridor.  Supple without meningismus. Cardiovascular: Normal rate, regular rhythm. Grossly normal heart sounds.  Good peripheral circulation. Respiratory: Normal respiratory effort.  No retractions. With diminished breath sounds bilaterally, worse in the left lung fields. Gastrointestinal: Soft and nontender. No distention.  No CVA tenderness. Genitourinary: Deferred Musculoskeletal: Prosthetic  legs status post bilateral amputations. Neurologic:  Normal speech and language. No gross focal neurologic deficits are appreciated.  Skin:  Skin is warm, dry and intact. No rash noted. Psychiatric: Mood and affect are normal. Speech and behavior are normal.  ____________________________________________   LABS (all labs ordered are listed, but only abnormal results are displayed)  Labs Reviewed  BASIC METABOLIC PANEL - Abnormal; Notable for the following:       Result Value   Chloride 97 (*)    Glucose,  Bld 126 (*)    BUN 24 (*)    Creatinine, Ser 3.57 (*)    Calcium 8.2 (*)    GFR calc non Af Amer 16 (*)    GFR calc Af Amer 18 (*)    All other components within normal limits  CBC - Abnormal; Notable for the following:    RBC 3.35 (*)    Hemoglobin 9.2 (*)    HCT 28.0 (*)    RDW 17.3 (*)    Platelets 98 (*)    All other components within normal limits  TROPONIN I - Abnormal; Notable for the following:    Troponin I 0.10 (*)    All other components within normal limits  BRAIN NATRIURETIC PEPTIDE - Abnormal; Notable for the following:    B Natriuretic Peptide 2,383.0 (*)    All other components within normal limits  CULTURE, BLOOD (ROUTINE X 2)  CULTURE, BLOOD (ROUTINE X 2)  LACTIC ACID, PLASMA  LACTIC ACID, PLASMA   ____________________________________________  EKG  ED ECG REPORT I, Dennis Montgomery, Linville Decarolis A, the attending physician, personally viewed and interpreted this ECG.   Date: 03/03/2016  EKG Time: 14:43  Rate: 87  Rhythm: normal sinus rhythm  Axis: normal  Intervals:none  ST&T Change: ST depression and T-wave inversions in lead 1 and aVL. ST depressions in V4, V5, V6. No acute ST segment elevation. Changes are new when compared to the EKG obtained at 2010.  ____________________________________________  RADIOLOGY  CXR IMPRESSION: 1. Left perihilar lung mass encompassing 6-8 cm. 2. Patchy opacity at the left lung base could reflect atelectasis or postobstructive infection in this setting. 3. Small bilateral pleural effusions suspected. 4. Calcified aortic atherosclerosis. ____________________________________________   PROCEDURES  Procedure(s) performed: None  Procedures  Critical Care performed: No  ____________________________________________   INITIAL IMPRESSION / ASSESSMENT AND PLAN / ED COURSE  Pertinent labs & imaging results that were available during my care of the patient were reviewed by me and considered in my medical decision making (see  chart for details).  Dennis GammaClarence Rilling Jr. is a 70 y.o. male with history of CHF, diabetes, end-stage renal disease on dialysis Monday Wednesday and Friday, last dialyzed yesterday, hypertension, diagnosed at [place] [place] [facility] with new diagnosis of metastatic lung cancer presents for evaluation of 2 days of worsening shortness of breath. On exam, he is fatigued. Mildly hypotensive, we'll be judicious with IV fluids given his end-stage renal disease. Remainder of his vital signs are stable and he is afebrile. CBC shows anemia, hemoglobin 9.2. BMP with creatinine elevation is expected in the setting of end-stage renal disease, normal potassium. BNP is elevated at greater than 2000 however the patient does not appear grossly volume overloaded. Chest x-ray shows concern for left-sided postobstructive pneumonia. We'll treat for age With IV vancomycin, Zosyn, Levaquin and [facility].  ----------------------------------------- 4:56 PM on 03/03/2016 -----------------------------------------  [facility]. He reports  that the troponin elevation is chronic. Blood pressure improved to 112/55 at this time. Heart rate normal at 79 bpm, normal O2 saturation at 98% on room air.  Clinical Course     ____________________________________________   FINAL CLINICAL IMPRESSION(S) / ED DIAGNOSES  Final diagnoses:  Dyspnea  HCAP (healthcare-associated pneumonia)  Troponin level elevated      NEW MEDICATIONS STARTED DURING THIS VISIT:  There are no discharge medications for this patient.    Note:  This document was prepared using Dragon voice recognition software and may include unintentional dictation errors.    Dennis Doss, MD 03/03/16 [id number]
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
## 2016-03-03 NOTE — ED Triage Notes (Signed)
Shortness of breath and weakness. Was discharged 2 days ago from [facility] for SOB. 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

## 2016-03-08 LAB — CULTURE, BLOOD (ROUTINE X 2)
Culture: NO GROWTH
Culture: NO GROWTH

## 2016-03-23 DEATH — deceased
